# Patient Record
Sex: Male | Born: 2000 | Race: White | Hispanic: No | Marital: Single | Smoking: Never smoker
Health system: Southern US, Community
[De-identification: ages and names within clinical notes are randomized; demographics above are authoritative.]

## PROBLEM LIST (undated history)

## (undated) ENCOUNTER — Emergency Department (HOSPITAL_COMMUNITY): Admission: EM | Discharge status: Absent without leave | Payer: Medicaid Other

## (undated) DIAGNOSIS — J301 Allergic rhinitis due to pollen: Secondary | ICD-10-CM

---
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
## 2021-02-01 IMAGING — CR LEFT WRIST - COMPLETE 3+ VIEW
4 series · 4 of 4 positions shown · non-contrast
Comparison: None.

CLINICAL DATA: Wrist pain.

EXAM:
LEFT WRIST - COMPLETE 3+ VIEW

[wrist pa]
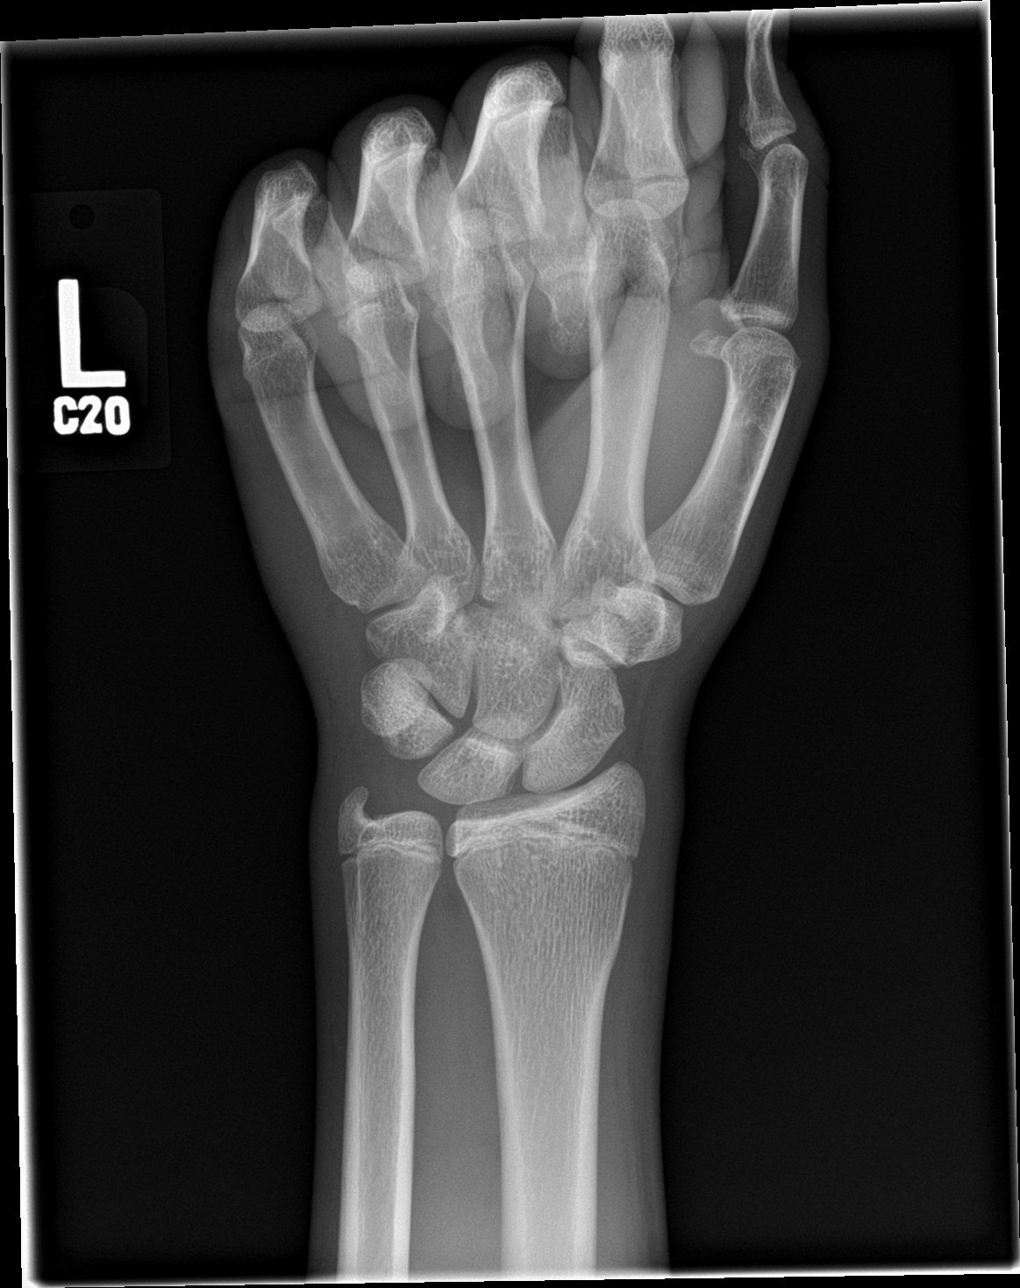

[wrist obl]
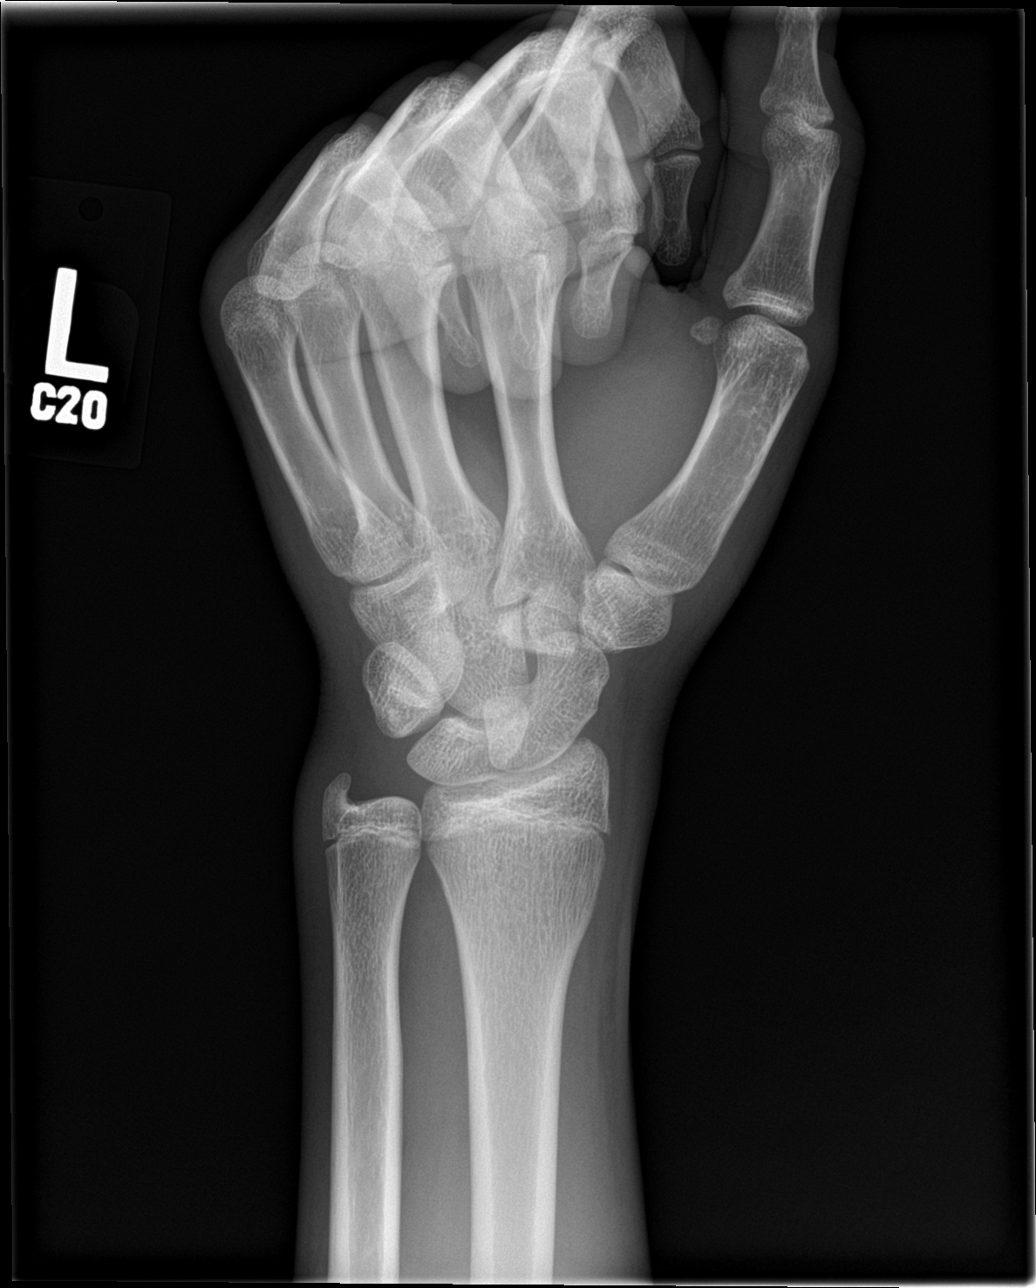

[wrist lat]
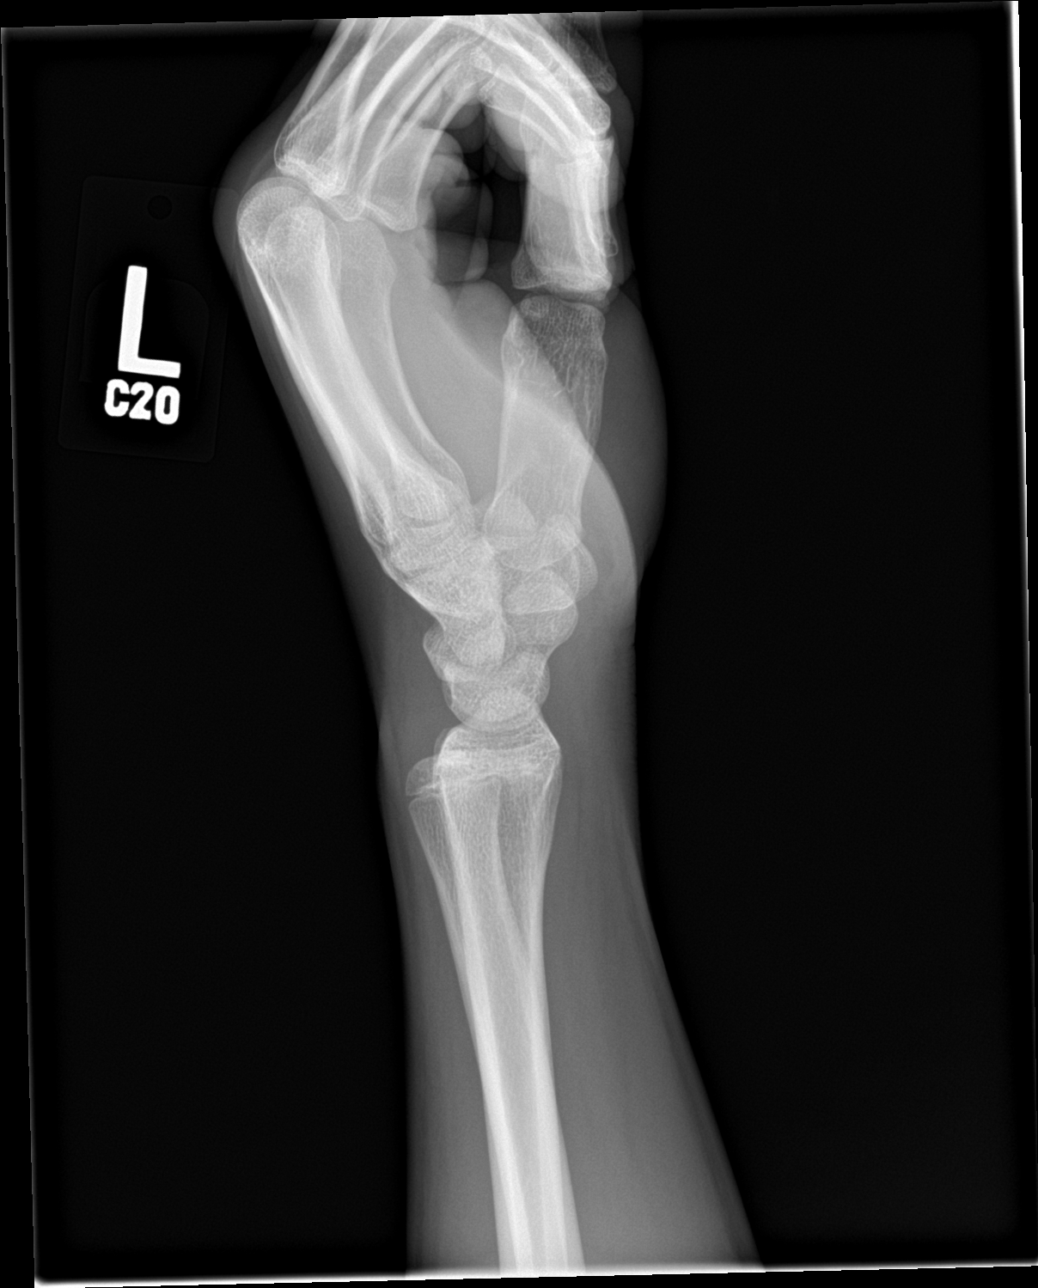

[wrist navicular]
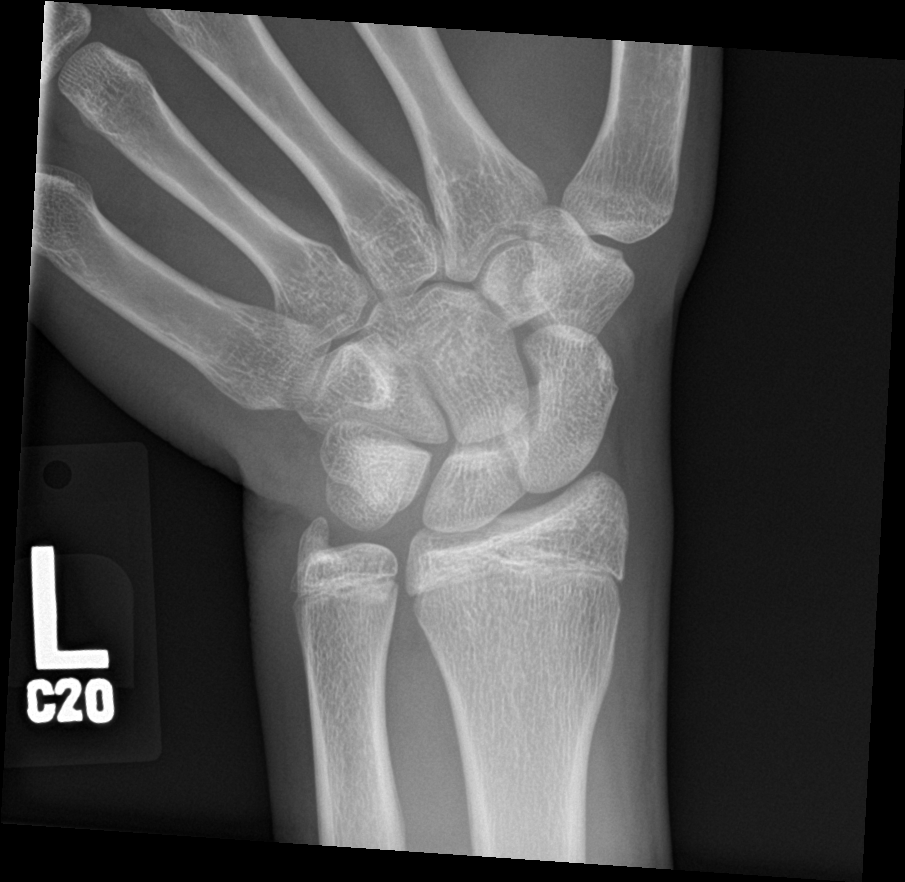

[4 of 4 positions shown; findings below may reference images not displayed]

FINDINGS: There is no evidence of fracture or dislocation. There is no
evidence of arthropathy or other focal bone abnormality. Soft
tissues are unremarkable.
IMPRESSION: Negative.

## 2022-01-05 ENCOUNTER — Ambulatory Visit (INDEPENDENT_AMBULATORY_CARE_PROVIDER_SITE_OTHER): Payer: Medicaid Other | Admitting: Family Medicine

## 2022-01-05 VITALS — BP 129/76 | HR 55 | Temp 97.7°F | Ht 66.0 in | Wt 117.0 lb

## 2022-01-05 DIAGNOSIS — K130 Diseases of lips: Secondary | ICD-10-CM

## 2022-01-05 DIAGNOSIS — B353 Tinea pedis: Secondary | ICD-10-CM

## 2022-01-05 MED ORDER — TERBINAFINE HCL 250 MG PO TABS: 250.0000 mg | ORAL_TABLET | Freq: Every day | ORAL | 0 refills | Status: AC

## 2022-01-05 MED ORDER — TERBINAFINE HCL 1 % EX CREA: 1.0000 "application " | TOPICAL_CREAM | Freq: Two times a day (BID) | CUTANEOUS | 3 refills | Status: AC

## 2022-01-05 NOTE — Patient Instructions (Signed)
We will place the referral for you. Our office will be in touch.  Medication as prescribed.  Keep feet as dry as you can.  Take care  Dr. Lacinda Axon

## 2022-01-05 NOTE — Progress Notes (Signed)
Subjective:  Patient ID: Clayton Jimenez, male    DOB: 04/10/2001  Age: 21 y.o. MRN: [id number]  CC: Chief Complaint  Patient presents with   Establish Care    Athletes foot, lip injury re-occurring     HPI:  21 year old male presents for evaluation of the above.  Patient states that he has an area of concern on the inside of his right lower lip.  He states that it has been present for 6 to 7 months.  It is slightly raised and noticeable at least to him.  He is unsure whether this was directly related to an incident where he bit his lip or prior trauma from finding.  Patient believes that he has a mucocele.  He is curious what can be done regarding this.  Patient also reports ongoing athlete's foot.  He states that he wears boots at work and thus has a lot of moisture.  He tries to change his socks often and keep his feet clean but has had difficulty controlling the athlete's foot.  He is interested in treatment today.  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   Tinea pedis of both feet 01/05/2022    Social Hx   Social History   Socioeconomic History   Marital status: Single    Spouse name: Not on file   Number of children: Not on file   Years of education: Not on file   Highest education level: Not on file  Occupational History   Not on file  Tobacco Use   Smoking status: Never   Smokeless tobacco: Never  Vaping Use   Vaping Use: Never used  Substance and Sexual Activity   Alcohol use: No   Drug use: No   Sexual activity: Never  Other Topics Concern   Not on file  Social History Narrative   Not on file   Social Determinants of Health   Financial Resource Strain: Not on file  Food Insecurity: Not on file  Transportation Needs: Not on file  Physical Activity: Not on file  Stress: Not on file  Social Connections: Not on file    Review of Systems Per HPI  Objective:  BP 129/76   Pulse (!) 55   Temp 97.7 F  (36.5 C)   Ht 5\' 6"  (1.676 m)   Wt 117 lb (53.1 kg)   SpO2 99%   BMI 18.88 kg/m      01/05/2022   10:32 AM 06/03/2020    3:42 PM 03/24/2020    8:54 PM  BP/Weight  Systolic BP 129 106 123  Diastolic BP 76 68 78  Wt. (Lbs) 117    BMI 18.88 kg/m2     Physical Exam Vitals and nursing note reviewed.  Constitutional:      General: He is not in acute distress.    Appearance: Normal appearance. He is not ill-appearing.  HENT:     Head: Normocephalic and atraumatic.     Mouth/Throat:     Comments: Small slightly raised area on the right lower lip consistent with a mucocele. Cardiovascular:     Rate and Rhythm: Normal rate and regular rhythm.  Pulmonary:     Effort: Pulmonary effort is normal.     Breath sounds: Normal breath sounds. No wheezing, rhonchi or rales.  Feet:     Comments: Tinea pedis noted bilaterally. Neurological:     Mental Status: He is alert.  Psychiatric:  Mood and Affect: Mood normal.        Behavior: Behavior normal.      Assessment & Plan:   Problem List Items Addressed This Visit       Digestive   Mucocele of lower lip - Primary    Referring to oral surgery for consideration for removal.      Relevant Orders   Ambulatory referral to Oral Maxillofacial Surgery     Musculoskeletal and Integument   Tinea pedis of both feet    Treating with terbinafine.      Relevant Medications   terbinafine (LAMISIL) 250 MG tablet   terbinafine (LAMISIL AT) 1 % cream    Meds ordered this encounter  Medications   terbinafine (LAMISIL) 250 MG tablet    Sig: Take 1 tablet (250 mg total) by mouth daily. For 2 weeks.    Dispense:  14 tablet    Refill:  0   terbinafine (LAMISIL AT) 1 % cream    Sig: Apply 1 application  topically 2 (two) times daily.    Dispense:  30 g    Refill:  3    Kanae Ignatowski DO [facility]

## 2022-01-05 NOTE — Assessment & Plan Note (Signed)
Treating with terbinafine.

## 2022-01-05 NOTE — Assessment & Plan Note (Signed)
Referring to oral surgery for consideration for removal.

## 2022-07-10 ENCOUNTER — Ambulatory Visit
Admission: EM | Admit: 2022-07-10 | Discharge: 2022-07-10 | Disposition: A | Discharge status: Home / Self Care | Payer: Medicaid Other | Attending: Family Medicine | Admitting: Family Medicine

## 2022-07-10 DIAGNOSIS — N50812 Left testicular pain: Secondary | ICD-10-CM

## 2022-07-10 DIAGNOSIS — R590 Localized enlarged lymph nodes: Secondary | ICD-10-CM

## 2022-07-10 MED ORDER — SULFAMETHOXAZOLE-TRIMETHOPRIM 800-160 MG PO TABS: 1.0000 | ORAL_TABLET | Freq: Two times a day (BID) | ORAL | 0 refills | Status: AC

## 2022-07-10 NOTE — ED Provider Notes (Signed)
[facility]    CSN: [id number] Arrival date & time: 07/10/22  1241      History   Chief Complaint Chief Complaint  Patient presents with   Groin Pain    HPI Clayton Jimenez is a 21 y.o. male.   Patient presenting today with almost 2 weeks of left groin region tenderness and areas of swelling and now the past 2 days has had left testicular pain and notices a lump coming just off the testicle.  Denies fever, chills, rashes, lesions, pelvic or abdominal pain, penile discharge, dysuria, hematuria, urinary frequency.  So far not trying anything over-the-counter for symptoms.  He denies any possibility of STDs and does not wish to be tested today.  No past history of similar issues.    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 No pertinent surgical history.     Home Medications    Prior to Admission medications   Medication Sig Start Date End Date Taking? Authorizing Provider  sulfamethoxazole-trimethoprim (BACTRIM DS) 800-160 MG tablet Take 1 tablet by mouth 2 (two) times daily for 7 days. 07/10/22 07/17/22 Yes Particia Nearing, PA-C  terbinafine (LAMISIL AT) 1 % cream Apply 1 application  topically 2 (two) times daily. 01/05/22   Tommie Sams, DO  terbinafine (LAMISIL) 250 MG tablet Take 1 tablet (250 mg total) by mouth daily. For 2 weeks. 01/05/22   Tommie Sams, DO    Family History Family History  Problem Relation Age of Onset   Heart disease Other    Arthritis Other    Lung disease Other    Asthma Other     Social History Social History   Tobacco Use   Smoking status: Never   Smokeless tobacco: Never  Vaping Use   Vaping Use: Never used  Substance Use Topics   Alcohol use: Never   Drug use: Yes    Frequency: 5.0 times per week     Allergies   Patient has no known  allergies.   Review of Systems Review of Systems Per HPI  Physical Exam Triage Vital Signs ED Triage Vitals  Enc Vitals Group     BP 07/10/22 1400 103/63     Pulse Rate 07/10/22 1400 60     Resp 07/10/22 1400 16     Temp 07/10/22 1400 99.2 F (37.3 C)     Temp Source 07/10/22 1400 Oral     SpO2 07/10/22 1400 97 %     Weight --      Height --      Head Circumference --      Peak Flow --      Pain Score 07/10/22 1402 5     Pain Loc --      Pain Edu? --      Excl. in GC? --    No data found.  Updated Vital Signs BP 103/63 (BP Location: Right Arm)   Pulse 60   Temp 99.2 F (37.3 C) (Oral)   Resp 16   SpO2 97%   Visual Acuity Right Eye Distance:   Left Eye Distance:   Bilateral Distance:    Right Eye Near:   Left Eye Near:    Bilateral Near:     Physical Exam Vitals and nursing note reviewed. Exam  conducted with a chaperone present.  Constitutional:      Appearance: Normal appearance.  HENT:     Head: Atraumatic.  Eyes:     Extraocular Movements: Extraocular movements intact.     Conjunctiva/sclera: Conjunctivae normal.  Cardiovascular:     Rate and Rhythm: Normal rate and regular rhythm.  Pulmonary:     Effort: Pulmonary effort is normal.     Breath sounds: Normal breath sounds.  Genitourinary:    Comments: Several slightly inflamed left inguinal lymph nodes Mobile mass palpable to the lower aspect of the left testicle, diffuse tenderness to palpation Musculoskeletal:        General: Normal range of motion.     Cervical back: Normal range of motion and neck supple.  Skin:    General: Skin is warm and dry.     Findings: No lesion or rash.  Neurological:     General: No focal deficit present.     Mental Status: He is oriented to person, place, and time.  Psychiatric:        Mood and Affect: Mood normal.        Thought Content: Thought content normal.        Judgment: Judgment normal.      UC Treatments / Results  Labs (all labs ordered are  listed, but only abnormal results are displayed) Labs Reviewed - No data to display  EKG   Radiology No results found.  Procedures Procedures (including critical care time)  Medications Ordered in UC Medications - No data to display  Initial Impression / Assessment and Plan / UC Course  I have reviewed the triage vital signs and the nursing notes.  Pertinent labs & imaging results that were available during my care of the patient were reviewed by me and considered in my medical decision making (see chart for details).     Unclear if infectious cause or more structural in nature, will treat with Bactrim, follow-up as soon as possible with urology for further evaluation and imaging if needed.  Follow-up sooner for worsening symptoms.  Again declines STD testing today.  Final Clinical Impressions(s) / UC Diagnoses   Final diagnoses:  Testicular pain, left  Inguinal adenopathy     Discharge Instructions      Take ibuprofen, use warm Epsom salt soaks to help ease your discomfort and I have sent over a course of antibiotics that will hopefully help resolve your symptoms.  I recommend that you follow-up with urology for further evaluation, particularly given the mass that you are feeling    ED Prescriptions     Medication Sig Dispense Auth. Provider   sulfamethoxazole-trimethoprim (BACTRIM DS) 800-160 MG tablet Take 1 tablet by mouth 2 (two) times daily for 7 days. 14 tablet Particia Nearing, [place]      PDMP not reviewed this encounter.   Particia Nearing, [place] 07/10/22 1512

## 2022-07-10 NOTE — ED Triage Notes (Signed)
Pt reports groin pain, left testicle pain x 1 1/2 week. Pain is worse when walking.

## 2022-07-10 NOTE — Discharge Instructions (Signed)
Take ibuprofen, use warm Epsom salt soaks to help ease your discomfort and I have sent over a course of antibiotics that will hopefully help resolve your symptoms.  I recommend that you follow-up with urology for further evaluation, particularly given the mass that you are feeling

## 2022-07-11 ENCOUNTER — Encounter: Payer: Self-pay | Admitting: Urology

## 2022-07-11 ENCOUNTER — Telehealth: Payer: Self-pay

## 2022-07-11 ENCOUNTER — Ambulatory Visit (INDEPENDENT_AMBULATORY_CARE_PROVIDER_SITE_OTHER): Payer: Medicaid Other | Admitting: Urology

## 2022-07-11 VITALS — BP 111/52 | HR 70 | Ht 66.0 in | Wt 128.0 lb

## 2022-07-11 DIAGNOSIS — N5089 Other specified disorders of the male genital organs: Secondary | ICD-10-CM

## 2022-07-11 DIAGNOSIS — N451 Epididymitis: Secondary | ICD-10-CM | POA: Diagnosis not present

## 2022-07-11 DIAGNOSIS — N453 Epididymo-orchitis: Secondary | ICD-10-CM

## 2022-07-11 LAB — URINALYSIS, ROUTINE W REFLEX MICROSCOPIC
Bilirubin, UA: NEGATIVE
Glucose, UA: NEGATIVE
Ketones, UA: NEGATIVE
Nitrite, UA: NEGATIVE
Protein,UA: NEGATIVE
RBC, UA: NEGATIVE
Specific Gravity, UA: 1.02 (ref 1.005–1.030)
Urobilinogen, Ur: 0.2 mg/dL (ref 0.2–1.0)
pH, UA: 7.5 (ref 5.0–7.5)

## 2022-07-11 LAB — MICROSCOPIC EXAMINATION
Bacteria, UA: NONE SEEN
RBC, Urine: NONE SEEN /hpf (ref 0–2)

## 2022-07-11 MED ORDER — DOXYCYCLINE HYCLATE 100 MG PO CAPS: 100.0000 mg | ORAL_CAPSULE | Freq: Two times a day (BID) | ORAL | 0 refills | Status: AC

## 2022-07-11 NOTE — Patient Instructions (Signed)
Epididymitis  Epididymitis is inflammation or swelling of the epididymis. This is caused by an infection. The epididymis is a cord-like structure that is located along the top and back part of the testicle. It collects and stores sperm from the testicle. This condition can also cause pain and swelling of the testicle and scrotum. Symptoms usually start suddenly (acute epididymitis). Sometimes epididymitis starts gradually and lasts for a while (chronic epididymitis). Chronic epididymitis may be harder to treat. What are the causes? In men ages 20-40, this condition is usually caused by a bacterial infection or a sexually transmitted infection (STI), such as gonorrhea or chlamydia. In men 40 and older, this condition is usually caused by bacteria from a urinary blockage or from abnormalities in the urinary system. These can result from: Having a tube placed into the bladder (urinary catheter). Having an enlarged or inflamed prostate gland. Having recently had urinary tract surgery. Having a problem with a backward flow of urine (retrograde). In men who have a condition that weakens the body's defense system (immune system), such as human immunodeficiency virus (HIV), this condition can be caused by: Other bacteria, including tuberculosis and syphilis. Viruses. Fungi. Sometimes this condition occurs without infection. This may happen because of trauma or repetitive activities such as sports. What increases the risk? You are more likely to develop this condition if you have: Unprotected sex with more than one partner. Anal sex. Had recent surgery. A urinary catheter. Urinary problems. A suppressed immune system. What are the signs or symptoms? This condition usually begins suddenly with chills, fever, and pain behind the scrotum and in the testicle. Other symptoms include: Swelling of the scrotum, testicle, or both. Pain when ejaculating or urinating. Pain in the back or  abdomen. Nausea. Itching and discharge from the penis. A frequent need to pass urine. Redness, increased warmth, and tenderness of the scrotum. How is this diagnosed? Your health care provider can diagnose this condition based on your symptoms and medical history. Your health care provider will also do a physical exam to check your scrotum and testicle for swelling, pain, and redness. You may also have other tests, including: Testing of discharge from the penis. Testing your urine for infections, such as STIs. Ultrasound to check for blood flow and inflammation. Your health care provider may test you for other STIs, including HIV. How is this treated? Treatment for this condition depends on the cause. If your condition is caused by a bacterial infection, oral antibiotic medicine may be prescribed. If the bacterial infection has spread to your blood, you may need to receive IV antibiotics. For both bacterial and nonbacterial epididymitis, you may be treated with: Rest. Elevation of the scrotum. Pain medicines. Anti-inflammatory medicines. Surgery may be needed if: You have pus buildup in the scrotum (abscess). You have epididymitis that has not responded to other treatments. Follow these instructions at home: Medicines Take over-the-counter and prescription medicines only as told by your health care provider. If you were prescribed an antibiotic medicine, take it as told by your health care provider. Do not stop taking the antibiotic even if your condition improves. Sexual activity If your epididymitis was caused by an STI, avoid sexual activity until your treatment is complete. Inform your sexual partner or partners if you test positive for an STI. They may need to be treated. Do not engage in sexual activity with your partner or partners until their treatment is completed. Managing pain and swelling  If directed, raise (elevate) your scrotum and apply ice.   To do this: Put ice in a  plastic bag. Place a small towel or pillow between your legs. Rest your scrotum on the pillow or towel. Place another towel between your skin and the plastic bag. Leave the ice on for 20 minutes, 2-3 times a day. Remove the ice if your skin turns bright red. This is very important. If you cannot feel pain, heat, or cold, you have a greater risk of damage to the area. Keep your scrotum elevated and supported while resting. Ask your health care provider if you should wear a scrotal support, such as a jockstrap. Wear it as told by your health care provider. Try taking a sitz bath to help with discomfort. This is a warm water bath that is taken while you are sitting down. The water should come up to your hips and should cover your buttocks. Do this 3-4 times per day or as told by your health care provider. General instructions Drink enough fluid to keep your urine pale yellow. Return to your normal activities as told by your health care provider. Ask your health care provider what activities are safe for you. Keep all follow-up visits. This is important. Contact a health care provider if: You have a fever. Your pain medicine is not helping. Your pain is getting worse. Your symptoms do not improve within 3 days. Summary Epididymitis is inflammation or swelling of the epididymis. This is caused by an infection. This condition can also cause pain and swelling of the testicle and scrotum. Treatment for this condition depends on the cause. If your condition is caused by a bacterial infection, oral antibiotic medicine may be prescribed. Inform your sexual partner or partners if you test positive for an STI. They may need to be treated. Do not engage in sexual activity with your partner or partners until their treatment is completed. Contact a health care provider if your symptoms do not improve within 3 days. This information is not intended to replace advice given to you by your health care provider.  Make sure you discuss any questions you have with your health care provider. Document Revised: 02/22/2021 Document Reviewed: 02/22/2021 Elsevier Patient Education  2023 [employer]  

## 2022-07-11 NOTE — Telephone Encounter (Signed)
Patient would like ua results from today's specimen.  Thanks, Rosey Bath

## 2022-07-11 NOTE — Progress Notes (Signed)
07/11/2022 10:23 AM   Clayton Jimenez 28-May-2001 [id number]  Referring provider: Tommie Sams, DO [address] [place],  [place] [address]  Left testicular pain   HPI:  Clayton Jimenez is a 21yo here for evaluation of left testicular pain. Starting 2 weeks ago he developed sharp left testicular pain which he worsened to where he has pain with ambulation. No trauma. He was seen in the ER and started on Bactrim DS He denies any LUTS. NO dysuria.  PMH: Past Medical History:  Diagnosis Date   Hay fever     Surgical History: No past surgical history on file.  Home Medications:  Allergies as of 07/11/2022   No Known Allergies      Medication List        Accurate as of July 11, 2022 10:23 AM. If you have any questions, ask your nurse or doctor.          sulfamethoxazole-trimethoprim 800-160 MG tablet Commonly known as: BACTRIM DS Take 1 tablet by mouth 2 (two) times daily for 7 days.   terbinafine 1 % cream Commonly known as: LamISIL AT Apply 1 application  topically 2 (two) times daily.   terbinafine 250 MG tablet Commonly known as: LAMISIL Take 1 tablet (250 mg total) by mouth daily. For 2 weeks.        Allergies: No Known Allergies  Family History: Family History  Problem Relation Age of Onset   Heart disease Other    Arthritis Other    Lung disease Other    Asthma Other     Social History:  reports that he has never smoked. He has never used smokeless tobacco. He reports current drug use. Frequency: 5.00 times per week. He reports that he does not drink alcohol.  ROS: All other review of systems were reviewed and are negative except what is noted above in HPI  Physical Exam: BP (!) 111/52   Pulse 70   Ht 5\' 6"  (1.676 m)   Wt 128 lb (58.1 kg)   BMI 20.66 kg/m   Constitutional:  Alert and oriented, No acute distress. HEENT: [place] AT, moist mucus membranes.  Trachea midline, no masses. Cardiovascular: No clubbing, cyanosis, or  edema. Respiratory: Normal respiratory effort, no increased work of breathing. GI: Abdomen is soft, nontender, nondistended, no abdominal masses GU: No CVA tenderness. Circumcised phallus. No masses/lesions on penis, testis, scrotum. Tender left epididymis Lymph: No cervical or inguinal lymphadenopathy. Skin: No rashes, bruises or suspicious lesions. Neurologic: Grossly intact, no focal deficits, moving all 4 extremities. Psychiatric: Normal mood and affect.  Laboratory Data: No results found for: "WBC", "HGB", "HCT", "MCV", "PLT"  No results found for: "CREATININE"  No results found for: "PSA"  No results found for: "TESTOSTERONE"  No results found for: "HGBA1C"  Urinalysis No results found for: "COLORURINE", "APPEARANCEUR", "LABSPEC", "PHURINE", "GLUCOSEU", "HGBUR", "BILIRUBINUR", "KETONESUR", "PROTEINUR", "UROBILINOGEN", "NITRITE", "LEUKOCYTESUR"  No results found for: "LABMICR", "WBCUA", "RBCUA", "LABEPIT", "MUCUS", "BACTERIA"  Pertinent Imaging:  No results found for this or any previous visit.  No results found for this or any previous visit.  No results found for this or any previous visit.  No results found for this or any previous visit.  No results found for this or any previous visit.  No valid procedures specified. No results found for this or any previous visit.  No results found for this or any previous visit.   Assessment & Plan:    1. Epididymitis -doxycycline 100mg  BID for  14 days -followup 3 weeks with scrotal US   No follow-ups on file.  Wilkie Aye, MD  [facility] [place]

## 2022-07-11 NOTE — Telephone Encounter (Signed)
Made patient aware that the UA did not show any infection. Patient voiced understanding

## 2022-08-01 ENCOUNTER — Ambulatory Visit (HOSPITAL_COMMUNITY): Admission: RE | Admit: 2022-08-01 | Payer: Medicaid Other | Source: Ambulatory Visit

## 2022-08-06 ENCOUNTER — Ambulatory Visit: Payer: Medicaid Other | Admitting: Urology

## 2022-08-14 ENCOUNTER — Ambulatory Visit: Payer: Medicaid Other | Admitting: Urology
